# Patient Record
Sex: Female | Born: 1945 | Race: White | Hispanic: No
Health system: Southern US, Community
[De-identification: ages and names within clinical notes are randomized; demographics above are authoritative.]

---

## 1986-06-06 ENCOUNTER — Encounter: Payer: Self-pay | Admitting: Endocrinology

## 1998-10-10 ENCOUNTER — Other Ambulatory Visit: Admission: RE | Admit: 1998-10-10 | Discharge: 1998-10-10 | Payer: Self-pay | Admitting: *Deleted

## 1999-10-17 ENCOUNTER — Other Ambulatory Visit: Admission: RE | Admit: 1999-10-17 | Discharge: 1999-10-17 | Payer: Self-pay | Admitting: *Deleted

## 2000-10-20 ENCOUNTER — Other Ambulatory Visit: Admission: RE | Admit: 2000-10-20 | Discharge: 2000-10-20 | Payer: Self-pay | Admitting: *Deleted

## 2001-10-25 ENCOUNTER — Other Ambulatory Visit: Admission: RE | Admit: 2001-10-25 | Discharge: 2001-10-25 | Payer: Self-pay | Admitting: *Deleted

## 2002-08-31 ENCOUNTER — Other Ambulatory Visit: Admission: RE | Admit: 2002-08-31 | Discharge: 2002-08-31 | Payer: Self-pay | Admitting: *Deleted

## 2002-10-12 ENCOUNTER — Ambulatory Visit (HOSPITAL_COMMUNITY): Admission: RE | Admit: 2002-10-12 | Discharge: 2002-10-12 | Payer: Self-pay | Admitting: Gastroenterology

## 2002-10-12 ENCOUNTER — Encounter (INDEPENDENT_AMBULATORY_CARE_PROVIDER_SITE_OTHER): Payer: Self-pay | Admitting: Specialist

## 2002-11-08 ENCOUNTER — Other Ambulatory Visit: Admission: RE | Admit: 2002-11-08 | Discharge: 2002-11-08 | Payer: Self-pay | Admitting: *Deleted

## 2003-02-16 ENCOUNTER — Other Ambulatory Visit: Admission: RE | Admit: 2003-02-16 | Discharge: 2003-02-16 | Payer: Self-pay | Admitting: *Deleted

## 2003-10-09 ENCOUNTER — Other Ambulatory Visit: Admission: RE | Admit: 2003-10-09 | Discharge: 2003-10-09 | Payer: Self-pay | Admitting: *Deleted

## 2004-02-07 ENCOUNTER — Other Ambulatory Visit: Admission: RE | Admit: 2004-02-07 | Discharge: 2004-02-07 | Payer: Self-pay | Admitting: *Deleted

## 2004-05-14 ENCOUNTER — Other Ambulatory Visit: Admission: RE | Admit: 2004-05-14 | Discharge: 2004-05-14 | Payer: Self-pay | Admitting: *Deleted

## 2004-11-27 ENCOUNTER — Other Ambulatory Visit: Admission: RE | Admit: 2004-11-27 | Discharge: 2004-11-27 | Payer: Self-pay | Admitting: *Deleted

## 2005-03-18 ENCOUNTER — Other Ambulatory Visit: Admission: RE | Admit: 2005-03-18 | Discharge: 2005-03-18 | Payer: Self-pay | Admitting: *Deleted

## 2005-08-12 ENCOUNTER — Other Ambulatory Visit: Admission: RE | Admit: 2005-08-12 | Discharge: 2005-08-12 | Payer: Self-pay | Admitting: *Deleted

## 2005-11-17 ENCOUNTER — Other Ambulatory Visit: Admission: RE | Admit: 2005-11-17 | Discharge: 2005-11-17 | Payer: Self-pay | Admitting: *Deleted

## 2006-02-20 ENCOUNTER — Encounter: Admission: RE | Admit: 2006-02-20 | Discharge: 2006-02-20 | Payer: Self-pay | Admitting: *Deleted

## 2006-04-02 ENCOUNTER — Ambulatory Visit: Payer: Self-pay | Admitting: Internal Medicine

## 2006-06-04 ENCOUNTER — Other Ambulatory Visit: Admission: RE | Admit: 2006-06-04 | Discharge: 2006-06-04 | Payer: Self-pay | Admitting: *Deleted

## 2006-09-24 ENCOUNTER — Ambulatory Visit (HOSPITAL_COMMUNITY): Admission: RE | Admit: 2006-09-24 | Discharge: 2006-09-24 | Payer: Self-pay | Admitting: *Deleted

## 2006-09-24 ENCOUNTER — Encounter (INDEPENDENT_AMBULATORY_CARE_PROVIDER_SITE_OTHER): Payer: Self-pay | Admitting: *Deleted

## 2006-11-12 ENCOUNTER — Ambulatory Visit: Payer: Self-pay | Admitting: Licensed Clinical Social Worker

## 2006-11-18 ENCOUNTER — Ambulatory Visit: Payer: Self-pay | Admitting: Licensed Clinical Social Worker

## 2006-11-25 ENCOUNTER — Ambulatory Visit: Payer: Self-pay | Admitting: Licensed Clinical Social Worker

## 2006-12-02 ENCOUNTER — Other Ambulatory Visit: Admission: RE | Admit: 2006-12-02 | Discharge: 2006-12-02 | Payer: Self-pay | Admitting: *Deleted

## 2006-12-04 ENCOUNTER — Ambulatory Visit: Payer: Self-pay | Admitting: Licensed Clinical Social Worker

## 2006-12-11 ENCOUNTER — Ambulatory Visit: Payer: Self-pay | Admitting: Licensed Clinical Social Worker

## 2006-12-16 ENCOUNTER — Ambulatory Visit: Payer: Self-pay | Admitting: Licensed Clinical Social Worker

## 2006-12-18 ENCOUNTER — Ambulatory Visit: Payer: Self-pay | Admitting: Licensed Clinical Social Worker

## 2006-12-23 ENCOUNTER — Ambulatory Visit: Payer: Self-pay | Admitting: Licensed Clinical Social Worker

## 2006-12-30 ENCOUNTER — Ambulatory Visit: Payer: Self-pay | Admitting: Licensed Clinical Social Worker

## 2007-01-05 ENCOUNTER — Ambulatory Visit: Payer: Self-pay | Admitting: Licensed Clinical Social Worker

## 2007-01-12 ENCOUNTER — Ambulatory Visit: Payer: Self-pay | Admitting: Licensed Clinical Social Worker

## 2007-01-22 ENCOUNTER — Ambulatory Visit: Payer: Self-pay | Admitting: Licensed Clinical Social Worker

## 2007-01-29 ENCOUNTER — Ambulatory Visit: Payer: Self-pay | Admitting: Licensed Clinical Social Worker

## 2007-02-05 ENCOUNTER — Ambulatory Visit: Payer: Self-pay | Admitting: Licensed Clinical Social Worker

## 2007-02-08 ENCOUNTER — Ambulatory Visit: Payer: Self-pay | Admitting: Licensed Clinical Social Worker

## 2007-02-12 ENCOUNTER — Ambulatory Visit: Payer: Self-pay | Admitting: Licensed Clinical Social Worker

## 2007-02-16 ENCOUNTER — Ambulatory Visit: Payer: Self-pay | Admitting: Licensed Clinical Social Worker

## 2007-02-25 ENCOUNTER — Ambulatory Visit: Payer: Self-pay | Admitting: Licensed Clinical Social Worker

## 2007-03-03 ENCOUNTER — Ambulatory Visit: Payer: Self-pay | Admitting: Licensed Clinical Social Worker

## 2007-03-05 ENCOUNTER — Ambulatory Visit: Payer: Self-pay | Admitting: Licensed Clinical Social Worker

## 2007-03-10 ENCOUNTER — Ambulatory Visit: Payer: Self-pay | Admitting: Licensed Clinical Social Worker

## 2007-03-19 ENCOUNTER — Ambulatory Visit: Payer: Self-pay | Admitting: Licensed Clinical Social Worker

## 2007-03-26 ENCOUNTER — Ambulatory Visit: Payer: Self-pay | Admitting: Licensed Clinical Social Worker

## 2007-03-30 ENCOUNTER — Ambulatory Visit: Payer: Self-pay | Admitting: Licensed Clinical Social Worker

## 2007-04-09 ENCOUNTER — Ambulatory Visit: Payer: Self-pay | Admitting: Licensed Clinical Social Worker

## 2007-06-29 ENCOUNTER — Other Ambulatory Visit: Admission: RE | Admit: 2007-06-29 | Discharge: 2007-06-29 | Payer: Self-pay | Admitting: *Deleted

## 2007-07-09 ENCOUNTER — Ambulatory Visit: Payer: Self-pay | Admitting: Licensed Clinical Social Worker

## 2007-10-26 ENCOUNTER — Ambulatory Visit: Payer: Self-pay | Admitting: Endocrinology

## 2007-10-26 DIAGNOSIS — E7800 Pure hypercholesterolemia, unspecified: Secondary | ICD-10-CM | POA: Insufficient documentation

## 2007-10-26 DIAGNOSIS — R7989 Other specified abnormal findings of blood chemistry: Secondary | ICD-10-CM | POA: Insufficient documentation

## 2007-10-26 DIAGNOSIS — E039 Hypothyroidism, unspecified: Secondary | ICD-10-CM | POA: Insufficient documentation

## 2007-10-26 LAB — CONVERTED CEMR LAB
ALT: 18 units/L (ref 0–35)
Albumin: 3.9 g/dL (ref 3.5–5.2)
BUN: 20 mg/dL (ref 6–23)
Bilirubin, Direct: 0.1 mg/dL (ref 0.0–0.3)
Calcium: 9 mg/dL (ref 8.4–10.5)
Chloride: 102 meq/L (ref 96–112)
Cholesterol: 257 mg/dL (ref 0–200)
Direct LDL: 185.3 mg/dL
GFR calc Af Amer: 82 mL/min
HDL: 44.8 mg/dL (ref >39.0)
Potassium: 4.2 meq/L (ref 3.5–5.1)
Sodium: 140 meq/L (ref 135–145)
Total Bilirubin: 0.8 mg/dL (ref 0.3–1.2)
Total CHOL/HDL Ratio: 5.7
VLDL: 25 mg/dL (ref 0–40)

## 2007-10-27 ENCOUNTER — Ambulatory Visit: Payer: Self-pay | Admitting: Endocrinology

## 2007-10-27 DIAGNOSIS — R9431 Abnormal electrocardiogram [ECG] [EKG]: Secondary | ICD-10-CM

## 2007-10-27 DIAGNOSIS — E785 Hyperlipidemia, unspecified: Secondary | ICD-10-CM

## 2007-11-10 ENCOUNTER — Ambulatory Visit: Payer: Self-pay

## 2007-11-10 ENCOUNTER — Encounter: Payer: Self-pay | Admitting: Endocrinology

## 2007-11-19 ENCOUNTER — Ambulatory Visit: Payer: Self-pay | Admitting: Licensed Clinical Social Worker

## 2007-12-07 ENCOUNTER — Ambulatory Visit: Payer: Self-pay | Admitting: Licensed Clinical Social Worker

## 2007-12-17 ENCOUNTER — Encounter: Payer: Self-pay | Admitting: Internal Medicine

## 2007-12-17 ENCOUNTER — Ambulatory Visit: Payer: Self-pay | Admitting: Licensed Clinical Social Worker

## 2007-12-24 ENCOUNTER — Ambulatory Visit: Payer: Self-pay | Admitting: Licensed Clinical Social Worker

## 2007-12-30 ENCOUNTER — Other Ambulatory Visit: Admission: RE | Admit: 2007-12-30 | Discharge: 2007-12-30 | Payer: Self-pay | Admitting: *Deleted

## 2008-01-06 ENCOUNTER — Encounter: Payer: Self-pay | Admitting: Internal Medicine

## 2008-01-07 ENCOUNTER — Ambulatory Visit: Payer: Self-pay | Admitting: Licensed Clinical Social Worker

## 2008-01-28 ENCOUNTER — Ambulatory Visit: Payer: Self-pay | Admitting: Licensed Clinical Social Worker

## 2008-02-01 ENCOUNTER — Ambulatory Visit: Payer: Self-pay | Admitting: Gastroenterology

## 2008-02-03 ENCOUNTER — Ambulatory Visit: Payer: Self-pay | Admitting: Internal Medicine

## 2008-02-04 ENCOUNTER — Ambulatory Visit: Payer: Self-pay | Admitting: Licensed Clinical Social Worker

## 2008-02-15 ENCOUNTER — Ambulatory Visit: Payer: Self-pay | Admitting: Licensed Clinical Social Worker

## 2008-02-18 ENCOUNTER — Ambulatory Visit: Payer: Self-pay | Admitting: Gastroenterology

## 2008-02-25 ENCOUNTER — Ambulatory Visit: Payer: Self-pay | Admitting: Licensed Clinical Social Worker

## 2008-03-02 ENCOUNTER — Ambulatory Visit: Payer: Self-pay | Admitting: Licensed Clinical Social Worker

## 2008-04-21 ENCOUNTER — Ambulatory Visit: Payer: Self-pay | Admitting: Licensed Clinical Social Worker

## 2008-05-16 ENCOUNTER — Ambulatory Visit: Payer: Self-pay | Admitting: Licensed Clinical Social Worker

## 2008-05-26 ENCOUNTER — Ambulatory Visit: Payer: Self-pay | Admitting: Licensed Clinical Social Worker

## 2008-07-28 ENCOUNTER — Other Ambulatory Visit: Admission: RE | Admit: 2008-07-28 | Discharge: 2008-07-28 | Payer: Self-pay | Admitting: Gynecology

## 2008-08-01 ENCOUNTER — Ambulatory Visit (HOSPITAL_COMMUNITY): Admission: RE | Admit: 2008-08-01 | Discharge: 2008-08-01 | Payer: Self-pay | Admitting: Gynecology

## 2008-08-08 LAB — CONVERTED CEMR LAB: Pap Smear: NORMAL

## 2008-12-11 ENCOUNTER — Encounter: Payer: Self-pay | Admitting: Internal Medicine

## 2008-12-11 ENCOUNTER — Ambulatory Visit: Payer: Self-pay | Admitting: Internal Medicine

## 2008-12-11 DIAGNOSIS — J069 Acute upper respiratory infection, unspecified: Secondary | ICD-10-CM | POA: Insufficient documentation

## 2009-02-09 ENCOUNTER — Ambulatory Visit (HOSPITAL_COMMUNITY): Admission: RE | Admit: 2009-02-09 | Discharge: 2009-02-09 | Payer: Self-pay | Admitting: Gynecology

## 2009-02-27 ENCOUNTER — Telehealth (INDEPENDENT_AMBULATORY_CARE_PROVIDER_SITE_OTHER): Payer: Self-pay | Admitting: *Deleted

## 2009-03-02 DIAGNOSIS — K5730 Diverticulosis of large intestine without perforation or abscess without bleeding: Secondary | ICD-10-CM | POA: Insufficient documentation

## 2009-03-14 ENCOUNTER — Encounter: Payer: Self-pay | Admitting: Internal Medicine

## 2009-03-14 DIAGNOSIS — L255 Unspecified contact dermatitis due to plants, except food: Secondary | ICD-10-CM

## 2009-04-03 ENCOUNTER — Ambulatory Visit: Payer: Self-pay | Admitting: Internal Medicine

## 2009-04-03 DIAGNOSIS — M899 Disorder of bone, unspecified: Secondary | ICD-10-CM | POA: Insufficient documentation

## 2009-04-03 DIAGNOSIS — M949 Disorder of cartilage, unspecified: Secondary | ICD-10-CM

## 2009-04-04 ENCOUNTER — Encounter: Payer: Self-pay | Admitting: Internal Medicine

## 2009-04-05 LAB — CONVERTED CEMR LAB
ALT: 30 units/L (ref 0–35)
Albumin: 4.3 g/dL (ref 3.5–5.2)
BUN: 25 mg/dL — ABNORMAL HIGH (ref 6–23)
Basophils Relative: 0.7 % (ref 0.0–3.0)
Bilirubin Urine: NEGATIVE
Bilirubin, Direct: 0.1 mg/dL (ref 0.0–0.3)
CO2: 32 meq/L (ref 19–32)
Cholesterol: 190 mg/dL (ref 0–200)
Creatinine, Ser: 1.1 mg/dL (ref 0.4–1.2)
Eosinophils Absolute: 0.3 10*3/uL (ref 0.0–0.7)
Eosinophils Relative: 5.4 % — ABNORMAL HIGH (ref 0.0–5.0)
HCT: 41.3 % (ref 36.0–46.0)
Hemoglobin: 14.4 g/dL (ref 12.0–15.0)
Leukocytes, UA: NEGATIVE
Lymphs Abs: 1.4 10*3/uL (ref 0.7–4.0)
MCV: 93.4 fL (ref 78.0–100.0)
Monocytes Absolute: 0.3 10*3/uL (ref 0.1–1.0)
Monocytes Relative: 5.4 % (ref 3.0–12.0)
Neutro Abs: 3.4 10*3/uL (ref 1.4–7.7)
Nitrite: NEGATIVE
Potassium: 4 meq/L (ref 3.5–5.1)
RBC: 4.43 M/uL (ref 3.87–5.11)
Total Bilirubin: 1 mg/dL (ref 0.3–1.2)
Urine Glucose: NEGATIVE mg/dL
Urobilinogen, UA: 0.2 (ref 0.0–1.0)
Vit D, 25-Hydroxy: 99 ng/mL — ABNORMAL HIGH (ref 30–89)

## 2009-05-28 ENCOUNTER — Ambulatory Visit: Payer: Self-pay | Admitting: Internal Medicine

## 2009-05-28 DIAGNOSIS — R1904 Left lower quadrant abdominal swelling, mass and lump: Secondary | ICD-10-CM

## 2009-05-28 DIAGNOSIS — M25473 Effusion, unspecified ankle: Secondary | ICD-10-CM

## 2009-05-28 DIAGNOSIS — M25476 Effusion, unspecified foot: Secondary | ICD-10-CM | POA: Insufficient documentation

## 2009-06-01 ENCOUNTER — Ambulatory Visit: Payer: Self-pay | Admitting: Internal Medicine

## 2009-06-05 ENCOUNTER — Encounter (INDEPENDENT_AMBULATORY_CARE_PROVIDER_SITE_OTHER): Payer: Self-pay | Admitting: *Deleted

## 2009-06-18 ENCOUNTER — Ambulatory Visit: Payer: Self-pay | Admitting: Internal Medicine

## 2009-08-01 ENCOUNTER — Ambulatory Visit: Payer: Self-pay | Admitting: Internal Medicine

## 2009-08-01 DIAGNOSIS — N951 Menopausal and female climacteric states: Secondary | ICD-10-CM

## 2009-08-01 DIAGNOSIS — M67919 Unspecified disorder of synovium and tendon, unspecified shoulder: Secondary | ICD-10-CM | POA: Insufficient documentation

## 2009-08-01 DIAGNOSIS — M719 Bursopathy, unspecified: Secondary | ICD-10-CM

## 2009-08-02 ENCOUNTER — Encounter: Payer: Self-pay | Admitting: Internal Medicine

## 2009-08-08 ENCOUNTER — Telehealth: Payer: Self-pay | Admitting: Internal Medicine

## 2010-04-30 IMAGING — US US PELVIS COMPLETE MODIFY
1 series · 14 of 25 positions shown · non-contrast
Comparison: None

CLINICAL DATA: Uterine enlargement of the of exam.  Post menopausal
female. Prior history of left oophrectomy.

TRANSABDOMINAL AND TRANSVAGINAL ULTRASOUND OF PELVIS
TECHNIQUE: Both transabdominal and transvaginal ultrasound
examinations of the pelvis were performed including evaluation of
the uterus, ovaries, adnexal regions, and pelvic cul-de-sac.

[Series 1: us pelvis complete modify · 0.31mm/px · 14 of 46 slices shown]
[im 1/46]
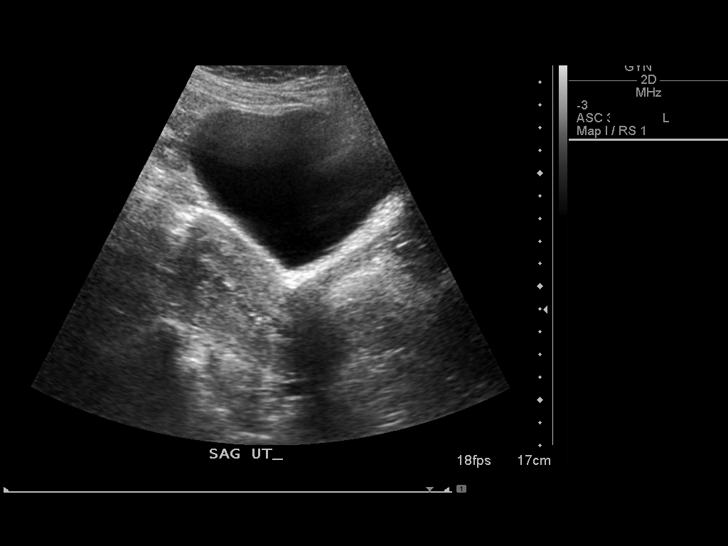
[im 4/46]
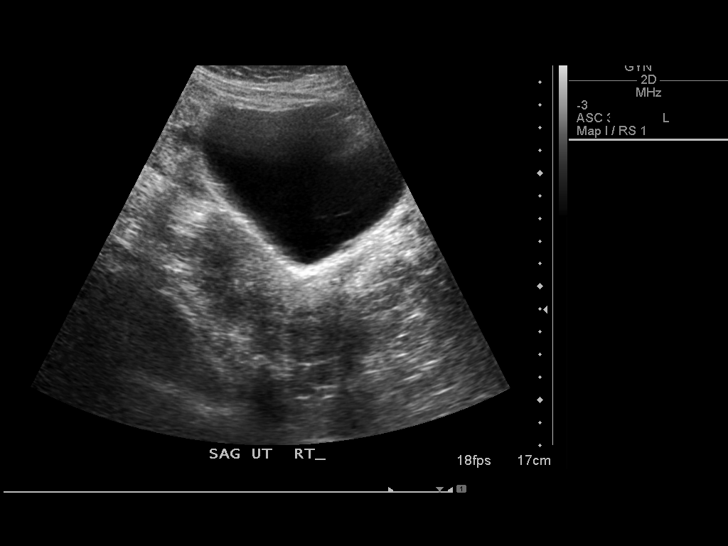
[im 8/46]
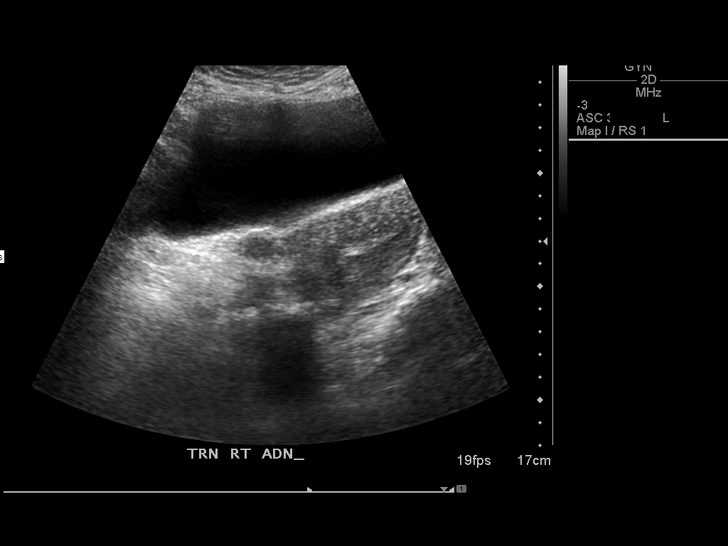
[im 12/46]
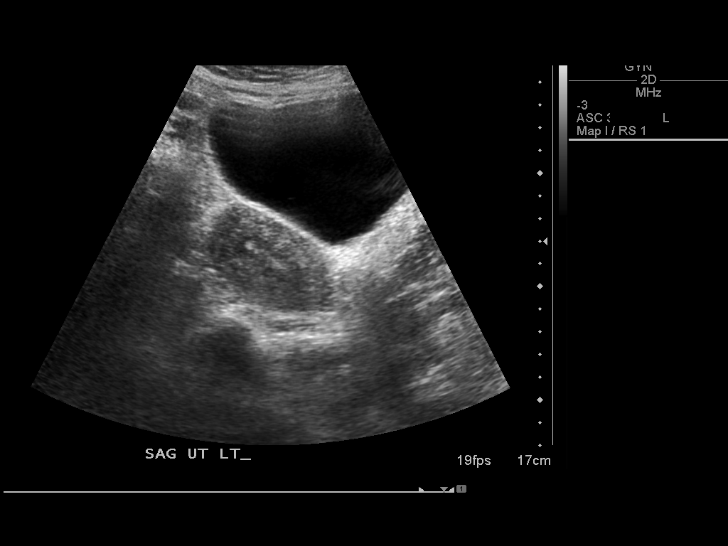
[im 16/46]
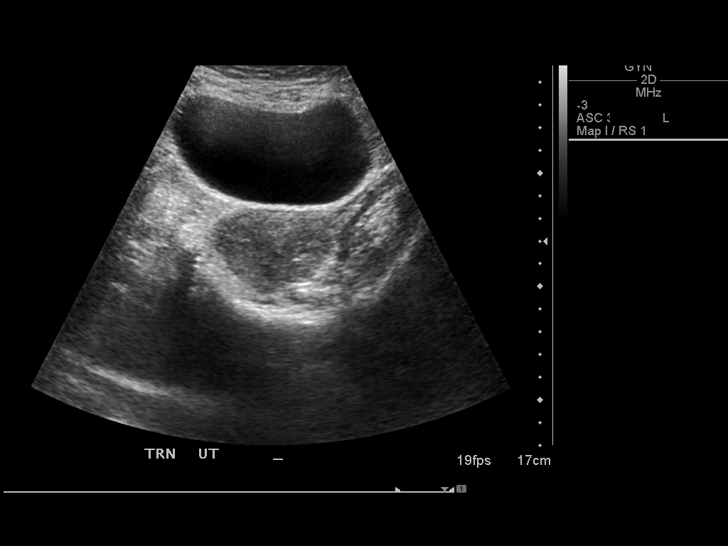
[im 17/46]
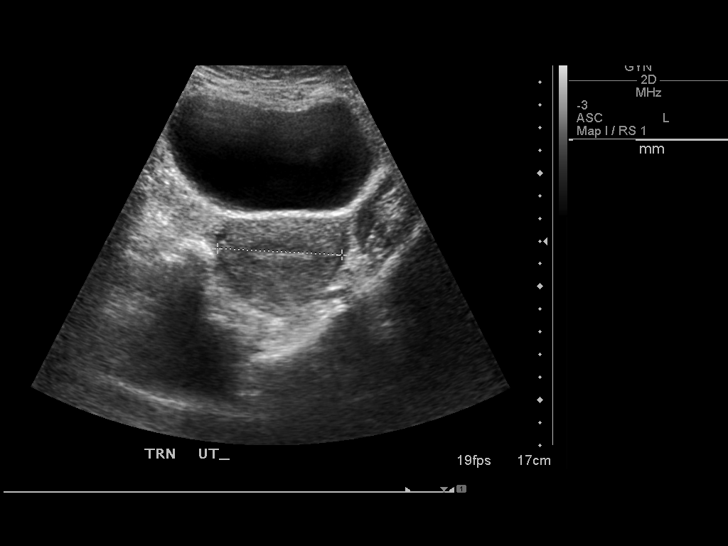
[im 21/46]
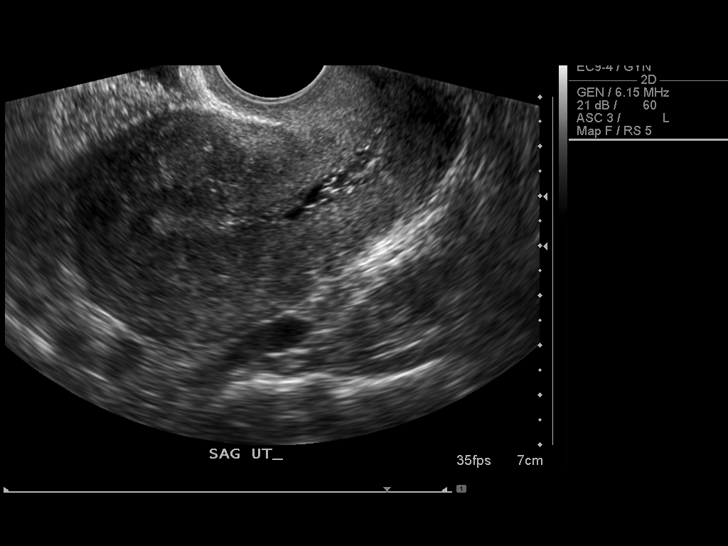
[im 25/46]
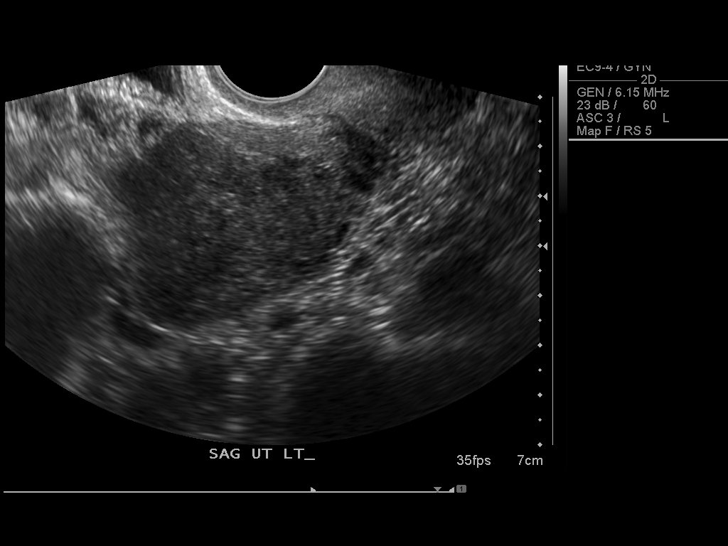
[im 29/46]
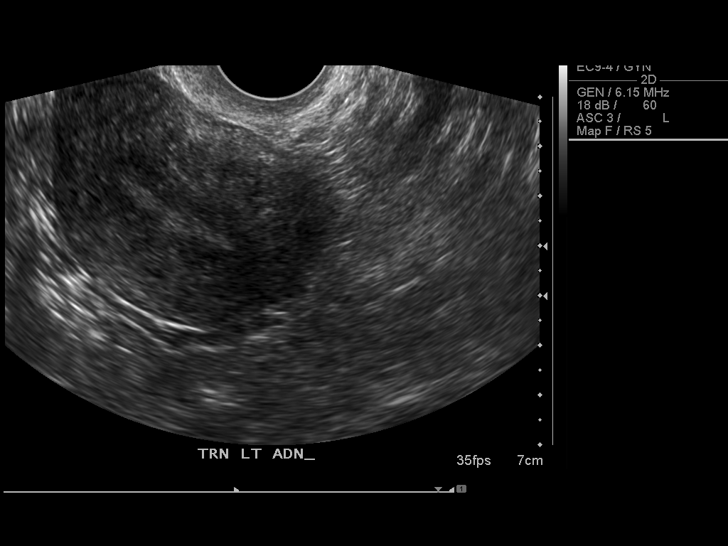
[im 31/46]
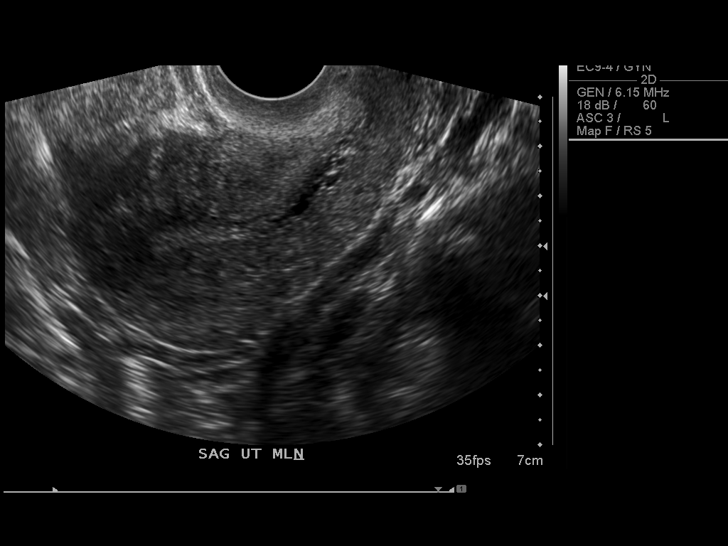
[im 34/46]
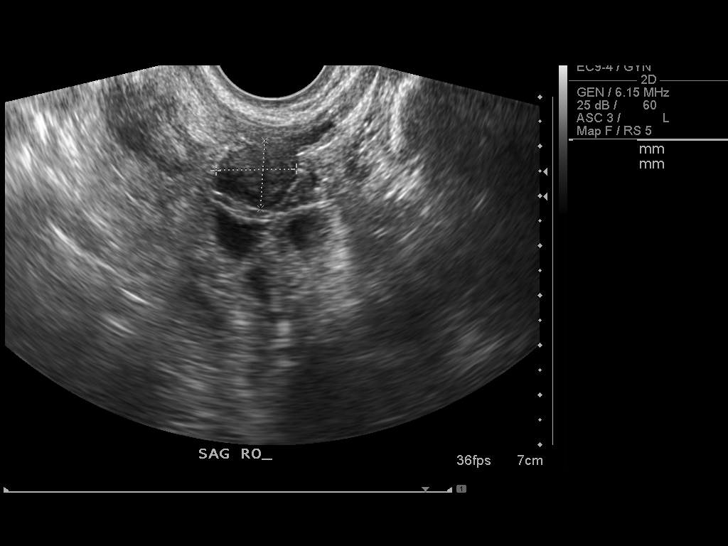
[im 38/46]
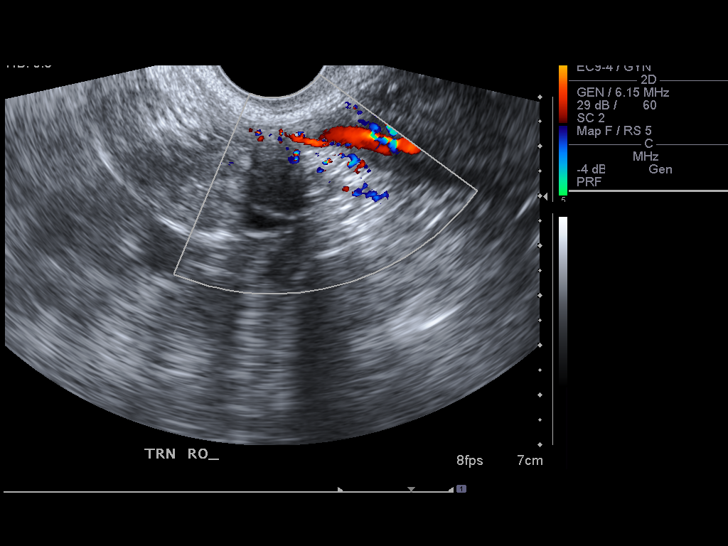
[im 42/46]
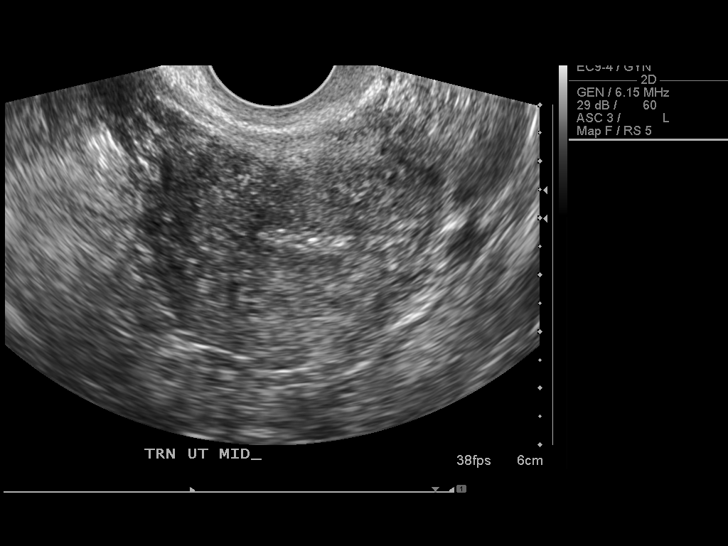
[im 46/46]
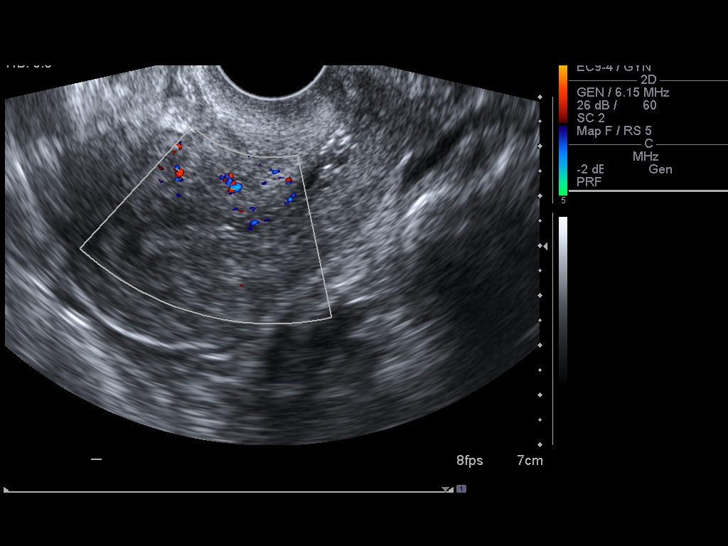

[14 of 25 positions shown; findings below may reference images not displayed]

FINDINGS: The uterus measures approximately 9 x 4 x 5 cm.  No
fibroids or other uterine masses are identified.  Endometrial
thickness measures 4 mm on transvaginal sonography.

A small post menopausal right ovary is visualized measuring 1.6 x
1.4 x 1.4 cm.  The left ovary is not directly visualized,
consistent with stated history of prior left oophrectomy

No adnexal masses are identified by either transabdominal or
transvaginal sonography.  There is no evidence of free fluid.
IMPRESSION: 1.  Normal appearance of the uterus.  No evidence of fibroids.
2.  Normal right ovary.  Prior left oophrectomy.

## 2010-07-24 ENCOUNTER — Ambulatory Visit: Payer: Self-pay | Admitting: Pulmonary Disease

## 2010-07-26 ENCOUNTER — Telehealth: Payer: Self-pay | Admitting: Adult Health

## 2010-09-04 ENCOUNTER — Ambulatory Visit: Payer: Self-pay | Admitting: Pulmonary Disease

## 2011-01-07 NOTE — Assessment & Plan Note (Signed)
Summary: Acute NP office visit - poison oak/ivy   Primary Provider/Referring Provider:  Newt Lukes MD  CC:  possible poison oak/ivy under chin, both arms, and both hands and torso since Sunday - has been using cortisone cream with no relief.  History of Present Illness: 65 yo WF with known hx of Hyperlipidemia followed by Dr. Leschber for primary care  July 24, 2010--Pt presents for an acute office visit. 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 She presents today due to very pruritic rash. Complains that she was exposed to poison ivy while on vacation. Complains of rash under chin, both arms, both hands and torso since Sunday-3 days ago.  - has been using cortisone cream with no relief. She has had poison ivy rash several times in past. Cortisone cream is not working -rash is getting worse. Denies chest pain, dyspnea, orthopnea, hemoptysis, fever, n/v/d, edema, headache, dypshagia, wheezing.   Medications Prior to Update: 1)  Synthroid 75 Mcg  Tabs (Levothyroxine Sodium) .... Take 1 By Mouth Q Am 2)  Restasis 0.05 %  Emul (Cyclosporine) .... Use 1 Drop in Each Eye Q Qm 3)  Travatan 0.004 %  Soln (Travoprost) .... Use 1 Drop in Each Eye Qhs 4)  Multivitamins   Tabs (Multiple Vitamin) .... Take 1 By Mouth Qd 5)  Fish Oil 1200 Mg  Caps (Omega-3 Fatty Acids) .... Take 1 By Mouth Qd 6)  Coq10 100 Mg  Caps (Coenzyme Q10) .... Take 1 By Mouth Q Am 7)  Crestor 10 Mg Tabs (Rosuvastatin Calcium) .... Take 1 By Mouth Qd 8)  Aspir-81 81 Mg Tbec (Aspirin) .... 1 By Mouth Every Other Day 9)  Paxil Cr 12.5 Mg Xr24h-Tab (Paroxetine Hcl) .... Take 1 By Mouth Once Daily  Current Medications (verified): 1)  Synthroid 75 Mcg  Tabs (Levothyroxine Sodium) .... Take 1 By Mouth Q Am 2)  Restasis 0.05 %  Emul (Cyclosporine) .... Use 1 Drop in Each Eye Q Qm 3)  Travatan 0.004 %  Soln (Travoprost) .... Use 1 Drop in Each Eye Qhs 4)  Multivitamins   Tabs (Multiple Vitamin) .... Take 1 By Mouth  Qd 5)  Fish Oil 1200 Mg  Caps (Omega-3 Fatty Acids) .... Take 1 By Mouth Qd 6)  Coq10 200 Mg Caps (Coenzyme Q10) .... Take 1 Capsule By Mouth Once A Day 7)  Crestor 10 Mg Tabs (Rosuvastatin Calcium) .... Take 1 By Mouth Qd 8)  Aspir-81 81 Mg Tbec (Aspirin) .... 1 By Mouth Every Other Day  Allergies (verified): 1)  Paxil  Past History:  Past Medical History: Last updated: 08/01/2009 Hyperlipidemia Diverticulosis, colon hypothyroidism  Past Surgical History: Last updated: 04/03/2009 Rest/Stress Myoview 11/10/07 Tonsillectomy - 1955  Family History: Last updated: 04/03/2009 mom died age 80 - bulbar palsy dad age 87 -   Social History: Last updated: 04/03/2009 single/widowed - (spouse comitted sucided 12/08) nonsmoker - never occ glass wine  Risk Factors: Alcohol Use: <1 (04/03/2009)  Risk Factors: Smoking Status: never (04/03/2009)  Review of Systems      See HPI  Vital Signs:  Patient profile:   65 year old female Height:      66 inches Weight:      136 pounds BMI:     22.03 O2 Sat:      10 0 % on Room air Pulse rate:   70 / minute BP sitting:   130 / 70  (left arm) Cuff size:   regular  Vitals Entered By:  Boone Master CNA/MA (July 24, 2010 9:13 AM)  O2 Flow:  Room air CC: possible poison oak/ivy under chin, both arms, both hands and torso since Sunday - has been using cortisone cream with no relief Is Patient Diabetic? No Comments Medications reviewed with patient Daytime contact number verified with patient. Boone Master CNA/MA  July 24, 2010 9:12 AM    Physical Exam  Additional Exam:  GEN: A/Ox3; pleasant , NAD HEENT:  [place]/AT, , EACs-clear, TMs-wnl, NOSE-clear, THROAT-clear NECK:  Supple w/ fair ROM; no JVD;  no lymphadenopathy. RESP  Clear to P & A; w/o, wheezes/ rales/ or rhonchi. CARD:  RRR, no m/r/g   GI:   Soft & nt; nml bowel sounds; no organomegaly or masses detected. Musco: Warm bil,  no edema  Derm: erythmatous linear excoriations  w/ few vesicles along neck, arms and hands    Impression & Recommendations:  Problem # 1:  CONTACT DERMATITIS DUE TO POISON IVY (ICD-692.6)  Depo Medrol 120mg  IM x 1  Zyrtec 10mg  at bedtime for 5 days Cortisone cream two times a day to rash, do not apply to face.  Please contact office for sooner follow up if symptoms do not improve or worsen   Orders: Est. Patient Level III (53664) Depo- Medrol 40mg  (J1030) Depo- Medrol 80mg  (J1040)  Medications Added to Medication List This Visit: 1)  Coq10 200 Mg Caps (Coenzyme q10) .... Take 1 capsule by mouth once a day  Complete Medication List: 1)  Synthroid 75 Mcg Tabs (Levothyroxine sodium) .... Take 1 by mouth q am 2)  Restasis 0.05 % Emul (Cyclosporine) .... Use 1 drop in each eye q qm 3)  Travatan 0.004 % Soln (Travoprost) .... Use 1 drop in each eye qhs 4)  Multivitamins Tabs (Multiple vitamin) .... Take 1 by mouth qd 5)  Fish Oil 1200 Mg Caps (Omega-3 fatty acids) .... Take 1 by mouth qd 6)  Coq10 200 Mg Caps (Coenzyme q10) .... Take 1 capsule by mouth once a day 7)  Crestor 10 Mg Tabs (Rosuvastatin calcium) .... Take 1 by mouth qd 8)  Aspir-81 81 Mg Tbec (Aspirin) .Marland Kitchen.. 1 by mouth every other day  Patient Instructions: 1)  Zyrtec 10mg  at bedtime for 5 days 2)  Steroid shot today.  3)  Cortisone cream two times a day to rash, do not apply to face.  4)  Please contact office for sooner follow up if symptoms do not improve or worsen    Immunization History:  Influenza Immunization History:    Influenza:  historical (09/07/2009)  Pneumovax Immunization History:    Pneumovax:  historical (09/08/2007)    Medication Administration  Injection # 3:    Medication: Depo- Medrol 40mg     Diagnosis: CONTACT DERMATITIS DUE TO POISON IVY (ICD-692.6)    Route: IM    Site: LUOQ gluteus    Exp Date: 08/11/2010    Lot #: [id number]    Mfr: [employer]    Patient tolerated injection without complications    Given by: Kandice Hams CMA  (July 24, 2010 9:37 AM)  Injection # 4:    Medication: Depo- Medrol 80mg     Diagnosis: CONTACT DERMATITIS DUE TO POISON IVY (ICD-692.6)    Route: IM    Site: LUOQ gluteus    Exp Date: 08/11/2010    Lot #: [id number]    Mfr: [employer]    Patient tolerated injection without complications    Given by: Kandice Hams CMA (July 24, 2010 9:38 AM)  Orders Added:  1)  Est. Patient Level III [23762] 2)  Depo- Medrol 40mg  [J1030] 3)  Depo- Medrol 80mg  [J1040]

## 2011-01-07 NOTE — Assessment & Plan Note (Signed)
Summary: hoarse/ mbw   Primary Provider/Referring Provider:  Newt Lukes MD  CC:  Pt is here for a sick visit. Pt c/o "scratchy throat" and chest congestion but unable to get up sputum.  Symptoms started 3 days ago. Pt denied f/c/s. Marland Kitchen  History of Present Illness: 65 yo WF with known hx of Hyperlipidemia followed by Dr. Felicity Coyer for primary care  July 24, 2010--Pt presents for an acute office visit. 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 She presents today due to very pruritic rash. Complains that she was exposed to poison ivy while on vacation. Complains of rash under chin, both arms, both hands and torso since Sunday-3 days ago.  - has been using cortisone cream with no relief. She has had poison ivy rash several times in past. Cortisone cream is not working -rash is getting worse. >>tx w/ depo medrol and steroid taper  September 04, 2010 --Presents for an acute office visit. Complains of  "scratchy throat" and chest congestion but unable to get up sputum.  Symptoms started 3 days ago. Used several otc meds without much help. She has lost her voice and hoarseness gets worse as day goes on. Denies chest pain, dyspnea, orthopnea, hemoptysis, fever, n/v/d, edema, headache,recent travel or antibiotics.   Allergies (verified): 1)  Paxil  Past History:  Past Medical History: Last updated: 08/01/2009 Hyperlipidemia Diverticulosis, colon hypothyroidism  Past Surgical History: Last updated: 04/03/2009 Rest/Stress Myoview 11/10/07 Tonsillectomy - 1955  Family History: Last updated: 04/03/2009 mom died age 80 - bulbar palsy dad age 87 -   Social History: Last updated: 04/03/2009 single/widowed - (spouse comitted sucided 12/08) nonsmoker - never occ glass wine  Review of Systems      See HPI  Vital Signs:  Patient profile:   65 year old female Height:      66 inches Weight:      138.38 pounds BMI:     22.42 O2 Sat:      99 % on Room air Temp:     97 .5  degrees F oral Pulse rate:   83 / minute BP sitting:   124 / 70  (left arm) Cuff size:   regular  Vitals Entered By: Boone Master CNA/MA (September 04, 2010 12:26 PM)  O2 Flow:  Room air CC: Pt is here for a sick visit. Pt c/o "scratchy throat" and chest congestion but unable to get up sputum.  Symptoms started 3 days ago. Pt denied f/c/s.  Comments meds and allergies reviewed with pt. Daytime contact number verified with patient. Boone Master CNA/MA  September 04, 2010 12:26 PM    Physical Exam  Additional Exam:  GEN: A/Ox3; pleasant , NAD, hoarse  HEENT:  [place]/AT, , EACs-clear, TMs-wnl, NOSE-pale w/ clear discharge  THROAT-clear NECK:  Supple w/ fair ROM; no JVD;  no lymphadenopathy. RESP  Clear to P & A; w/o, wheezes/ rales/ or rhonchi. CARD:  RRR, no m/r/g   GI:   Soft & nt; nml bowel sounds; no organomegaly or masses detected. Musco: Warm bil,  no edema  Derm: erythmatous linear excoriations w/ few vesicles along neck, arms and hands    Impression & Recommendations:  Problem # 1:  URI (ICD-465.9)  Allegra 180mg  once daily for drainage.  Mucinex DM two times a day as needed cough/congestion  Saline nasal rinses as needed congestion  Increase fluids and rest.  Alternate tylenol and advil as needed aches.  Voice rest.  Zpack to have on hold if symptoms  worsen or do not improve with discolored mucus.  Please contact office for sooner follow up if symptoms do not improve or worsen  Her updated medication list for this problem includes:    Aspir-81 81 Mg Tbec (Aspirin) .Marland Kitchen... 1 by mouth every other day  Orders: Est. Patient Level III (62952)  Medications Added to Medication List This Visit: 1)  Zithromax Z-pak 250 Mg Tabs (Azithromycin) .... Take as directed.  Complete Medication List: 1)  Synthroid 75 Mcg Tabs (Levothyroxine sodium) .... Take 1 by mouth q am 2)  Restasis 0.05 % Emul (Cyclosporine) .... Use 1 drop in each eye q qm 3)  Travatan 0.004 % Soln (Travoprost)  .... Use 1 drop in each eye qhs 4)  Multivitamins Tabs (Multiple vitamin) .... Take 1 by mouth qd 5)  Fish Oil 1200 Mg Caps (Omega-3 fatty acids) .... Take 1 by mouth qd 6)  Coq10 200 Mg Caps (Coenzyme q10) .... Take 1 capsule by mouth once a day 7)  Crestor 10 Mg Tabs (Rosuvastatin calcium) .... Take 1 by mouth qd 8)  Aspir-81 81 Mg Tbec (Aspirin) .Marland Kitchen.. 1 by mouth every other day 9)  Zithromax Z-pak 250 Mg Tabs (Azithromycin) .... Take as directed.  Patient Instructions: 1)  Allegra 180mg  once daily for drainage.  2)  Mucinex DM two times a day as needed cough/congestion  3)  Saline nasal rinses as needed congestion  4)  Increase fluids and rest.  5)  Alternate tylenol and advil as needed aches.  6)  Voice rest.  7)  Zpack to have on hold if symptoms worsen or do not improve with discolored mucus.  8)  Please contact office for sooner follow up if symptoms do not improve or worsen  Prescriptions: ZITHROMAX Z-PAK 250 MG TABS (AZITHROMYCIN) take as directed.  #1 x 0   Entered and Authorized by:   Rubye Oaks NP   Signed by:   Xander Jutras NP on 09/04/2010   Method used:   Print then Give to Patient   RxID:   [id number]

## 2011-01-07 NOTE — Progress Notes (Signed)
  Phone Note Other Incoming   Summary of Call: poison ivy is no better, got depo shot and taking zyrtec.  rash is not better.   Follow-up for Phone Call        4 tabs for 2 days, then 3 tabs for 2 days, 2 tabs for 2 days, then 1 tab for 2 days, then stop --taper as recommended.  Please contact office for sooner follow up if symptoms do not improve or worsen  Follow-up by: Tammy Parrett NP,  July 26, 2010 9:48 AM

## 2011-04-25 NOTE — Assessment & Plan Note (Signed)
[facility]                                 ON-CALL NOTE   NAME:FRITTSMailey, Landstrom                     MRN:          [id number]  DATE:09/19/2007                            DOB:          September 21, 1946    PHONE NUMBER:  [phone].   PRIMARY CARE PHYSICIAN:  Barbette Hair. Artist Pais, DO.   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  She  is being called to [place] to help take care of her daughter and the  family who all have pneumonia.  She is calling to determine what can be  done to prevent her getting pneumonia while she is over there.   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  I called in a prescription for Avelox 400 mg p.o. daily  x14 days to use if she begins to have symptoms while she is over there.  I discussed with her that I did not think taking it preventatively would  help.     Kerby Nora, MD  Electronically Signed    AB/MedQ  DD: 09/19/2007  DT: 09/19/2007  Job #: [id number]

## 2011-04-25 NOTE — Op Note (Signed)
NAME:  Anne Wagner, Anne Wagner                  ACCOUNT NO.:  [id number]   MEDICAL RECORD NO.:  [id number]          PATIENT TYPE:  AMB   LOCATION:  [facility]                           FACILITY:  [facility]   PHYSICIAN:  Almedia Balls. Fore, M.D.   DATE OF BIRTH:  [birth year]   DATE OF PROCEDURE:  09/24/2006  DATE OF DISCHARGE:                                 OPERATIVE REPORT   PREOPERATIVE DIAGNOSIS:  Pelvic pain, persistent left ovarian cyst.   POSTOPERATIVE DIAGNOSIS:  Pelvic pain, persistent left ovarian cyst, pending  pathology.   OPERATION:  Laparoscopy with left salpingo-oophorectomy.   ANESTHESIA:  General orotracheal.   INDICATIONS FOR SURGERY:  The patient is a 65 year old with the above-noted  problems.  She has been counseled as to the need for removal of the ovary.  She has also been counseled as to the risks involved including risks of  anesthesia, injury to uterus, tubes, ovaries, bowel, bladder, blood vessels,  ureters, postoperative hemorrhage, infection, recuperation.  She fully  understands all these considerations and signed informed consent to proceed  on September 24, 2006.   OPERATIVE FINDINGS:  On laparoscopy, the lower liver edge, gallbladder,  spleen, and area of the appendix were normal to visualization.  In the  pelvis, the right tube and ovary were normal with the ovary being atrophic.  There was a small myomata present on the right round ligament.  The uterus  was mid posterior top normal size.  The left ovary was involved with a cyst  which appeared to be simple but which had some solid components and was  approximately 8 cm in greatest dimensions.  The left tube appeared normal.   PROCEDURE:  With the patient under general anesthesia, prepared and draped  in the usual sterile fashion in the dorsal lithotomy position, a speculum  was placed in the vagina, a tenaculum and acorn cannula placed on the cervix  with insertion of a Robinson catheter and free flow of clear urine.   The  patient was then prepared and draped for a laparoscopic procedure.  An  incision was made in the lower pole of the umbilicus with insertion of the  Veress cannula and insufflation of 3 liters of carbon dioxide.  The  disposable 10/11 mm trocar was inserted through the umbilical incision.  The  operating scope was then placed through this incision and the patient was  insufflated with 4.5 liters of carbon dioxide.  A 10/11 disposable trocar  was placed through an incision above the symphysis pubis in the midline well  above the bladder reflection.  Pelvic washings were taken initially.  The  cyst was then aspirated using the long suction aspirator with clear fluid  being obtained.  The cyst and left adnexal structures were grasped and  elevated well away from the ureters and bowel.  Bipolar electrocoagulation  was used to render the infundibulopelvic ligament and peritoneal attachments  as well as the tube and utero-ovarian attachments hemostatic; these  structures were sequentially cut free with gradual with excision of the left  tube and ovary from their attachments.  The left tube and ovary were then  placed in an EndoCatch bag and removed through the lower incision without  difficulty.  The area was lavaged with copious amounts of lactated Ringer  solution and observed for hemostasis.  After noting that hemostasis was  maintained following reduction of intra-abdominal pressure by allowing gas  to escape and that the sponge and instrument counts were correct, the  instruments were removed from the peritoneal cavity.  Gas was allowed to  fully escape, and the incisions were closed with fascial sutures of 2-0  Vicryl and subcuticular suture of 3-0 plain catgut.  Estimated blood loss  less than 25 mL.  The patient was taken to the recovery room in good  condition following catheterization and free flow of clear urine.   FOLLOW-UP CARE:  She is to return to the office in two weeks for  follow-up  and to call if any bleeding, pain or unexplained fever should ensue.  She  was given a prescription for Darvocet N100 generic, #20, to be taken 1/2 to  2 q.6h. p.r.n. pain.  She was also given a prescription for Macrobid  generic, #8, to be taken two stat and one b.i.d..  The surgical specimen was  sent to pathology as were the pelvic washings and cyst fluid.           ______________________________  Almedia Balls. Randell Patient, M.D.     SRF/MEDQ  D:  09/24/2006  T:  09/25/2006  Job:  [id number]

## 2011-09-04 ENCOUNTER — Other Ambulatory Visit: Payer: Self-pay | Admitting: Gynecology

## 2014-06-01 ENCOUNTER — Other Ambulatory Visit: Payer: Self-pay | Admitting: Internal Medicine

## 2014-06-01 ENCOUNTER — Ambulatory Visit
Admission: RE | Admit: 2014-06-01 | Discharge: 2014-06-01 | Disposition: A | Discharge status: Home / Self Care | Payer: 59 | Source: Ambulatory Visit | Attending: Internal Medicine | Admitting: Internal Medicine

## 2014-06-01 DIAGNOSIS — M545 Low back pain, unspecified: Secondary | ICD-10-CM

## 2015-02-16 ENCOUNTER — Encounter: Payer: Self-pay | Admitting: Gastroenterology

## 2015-04-20 ENCOUNTER — Encounter: Payer: Self-pay | Admitting: Gastroenterology
